# Patient Record
Sex: Female | Born: 1950 | Race: Black or African American | Hispanic: No | Smoking: Former smoker
Health system: Southern US, Community
[De-identification: ages and names within clinical notes are randomized; demographics above are authoritative.]

## PROBLEM LIST (undated history)

## (undated) DIAGNOSIS — H9190 Unspecified hearing loss, unspecified ear: Secondary | ICD-10-CM

## (undated) DIAGNOSIS — I10 Essential (primary) hypertension: Secondary | ICD-10-CM

## (undated) DIAGNOSIS — I671 Cerebral aneurysm, nonruptured: Secondary | ICD-10-CM

## (undated) DIAGNOSIS — J449 Chronic obstructive pulmonary disease, unspecified: Secondary | ICD-10-CM

## (undated) HISTORY — PX: TUBAL LIGATION: SHX77

---

## 2017-12-25 ENCOUNTER — Emergency Department (HOSPITAL_BASED_OUTPATIENT_CLINIC_OR_DEPARTMENT_OTHER): Payer: Medicare Other

## 2017-12-25 ENCOUNTER — Emergency Department (HOSPITAL_BASED_OUTPATIENT_CLINIC_OR_DEPARTMENT_OTHER)
Admission: EM | Admit: 2017-12-25 | Discharge: 2017-12-25 | Disposition: A | Discharge status: Home / Self Care | Payer: Medicare Other | Attending: Emergency Medicine | Admitting: Emergency Medicine

## 2017-12-25 ENCOUNTER — Encounter (HOSPITAL_BASED_OUTPATIENT_CLINIC_OR_DEPARTMENT_OTHER): Payer: Self-pay

## 2017-12-25 ENCOUNTER — Other Ambulatory Visit: Payer: Self-pay

## 2017-12-25 DIAGNOSIS — R531 Weakness: Secondary | ICD-10-CM | POA: Diagnosis not present

## 2017-12-25 DIAGNOSIS — R0602 Shortness of breath: Secondary | ICD-10-CM | POA: Diagnosis not present

## 2017-12-25 DIAGNOSIS — R1010 Upper abdominal pain, unspecified: Secondary | ICD-10-CM

## 2017-12-25 DIAGNOSIS — Z87891 Personal history of nicotine dependence: Secondary | ICD-10-CM | POA: Insufficient documentation

## 2017-12-25 DIAGNOSIS — R030 Elevated blood-pressure reading, without diagnosis of hypertension: Secondary | ICD-10-CM

## 2017-12-25 DIAGNOSIS — Z7982 Long term (current) use of aspirin: Secondary | ICD-10-CM | POA: Diagnosis not present

## 2017-12-25 DIAGNOSIS — R110 Nausea: Secondary | ICD-10-CM

## 2017-12-25 DIAGNOSIS — J449 Chronic obstructive pulmonary disease, unspecified: Secondary | ICD-10-CM | POA: Insufficient documentation

## 2017-12-25 DIAGNOSIS — I10 Essential (primary) hypertension: Secondary | ICD-10-CM | POA: Diagnosis not present

## 2017-12-25 DIAGNOSIS — Z79899 Other long term (current) drug therapy: Secondary | ICD-10-CM | POA: Insufficient documentation

## 2017-12-25 HISTORY — DX: Chronic obstructive pulmonary disease, unspecified: J44.9

## 2017-12-25 HISTORY — DX: Essential (primary) hypertension: I10

## 2017-12-25 HISTORY — DX: Cerebral aneurysm, nonruptured: I67.1

## 2017-12-25 HISTORY — DX: Unspecified hearing loss, unspecified ear: H91.90

## 2017-12-25 LAB — COMPREHENSIVE METABOLIC PANEL
ALK PHOS: 68 U/L (ref 38–126)
ALT: 28 U/L (ref 14–54)
AST: 36 U/L (ref 15–41)
Albumin: 4 g/dL (ref 3.5–5.0)
Anion gap: 9 (ref 5–15)
BUN: 5 mg/dL — AB (ref 6–20)
CALCIUM: 9 mg/dL (ref 8.9–10.3)
CHLORIDE: 101 mmol/L (ref 101–111)
CO2: 22 mmol/L (ref 22–32)
CREATININE: 0.89 mg/dL (ref 0.44–1.00)
GFR calc Af Amer: >60 mL/min (ref >60)
GFR calc non Af Amer: >60 mL/min (ref >60)
GLUCOSE: 86 mg/dL (ref 65–99)
Potassium: 3.2 mmol/L — ABNORMAL LOW (ref 3.5–5.1)
SODIUM: 132 mmol/L — AB (ref 135–145)
Total Bilirubin: 0.4 mg/dL (ref 0.3–1.2)
Total Protein: 7 g/dL (ref 6.5–8.1)

## 2017-12-25 LAB — CBC WITH DIFFERENTIAL/PLATELET
BASOS ABS: 0 10*3/uL (ref 0.0–0.1)
Basophils Relative: 0 %
EOS ABS: 0.2 10*3/uL (ref 0.0–0.7)
EOS PCT: 4 %
HCT: 36.8 % (ref 36.0–46.0)
HEMOGLOBIN: 12.9 g/dL (ref 12.0–15.0)
LYMPHS ABS: 2.3 10*3/uL (ref 0.7–4.0)
LYMPHS PCT: 44 %
MCH: 28.7 pg (ref 26.0–34.0)
MCHC: 35.1 g/dL (ref 30.0–36.0)
MCV: 82 fL (ref 78.0–100.0)
Monocytes Absolute: 0.5 10*3/uL (ref 0.1–1.0)
Monocytes Relative: 9 %
NEUTROS PCT: 43 %
Neutro Abs: 2.3 10*3/uL (ref 1.7–7.7)
PLATELETS: 292 10*3/uL (ref 150–400)
RBC: 4.49 MIL/uL (ref 3.87–5.11)
RDW: 13.8 % (ref 11.5–15.5)
WBC: 5.3 10*3/uL (ref 4.0–10.5)

## 2017-12-25 LAB — LIPASE, BLOOD: Lipase: 21 U/L (ref 11–51)

## 2017-12-25 LAB — BRAIN NATRIURETIC PEPTIDE: B Natriuretic Peptide: 9.5 pg/mL (ref 0.0–100.0)

## 2017-12-25 LAB — TROPONIN I: Troponin I: <0.03 ng/mL (ref <0.03)

## 2017-12-25 MED ORDER — GI COCKTAIL ~~LOC~~
30.0000 mL | Freq: Once | ORAL | Status: AC
  Administered 2017-12-25: 30 mL via ORAL
  Filled 2017-12-25: qty 30

## 2017-12-25 MED ORDER — ONDANSETRON HCL 4 MG/2ML IJ SOLN
4.0000 mg | Freq: Once | INTRAMUSCULAR | Status: AC
  Administered 2017-12-25: 4 mg via INTRAVENOUS
  Filled 2017-12-25: qty 2

## 2017-12-25 MED ORDER — PANTOPRAZOLE SODIUM 20 MG PO TBEC: 20.0000 mg | DELAYED_RELEASE_TABLET | Freq: Every day | ORAL | 0 refills | Status: AC

## 2017-12-25 MED ORDER — ONDANSETRON 4 MG PO TBDP: ORAL_TABLET | ORAL | 0 refills | Status: AC

## 2017-12-25 NOTE — ED Triage Notes (Signed)
Pt c/o elevated BP today-hx of HTN-compliant with meds-NAD-steady gait

## 2017-12-25 NOTE — ED Provider Notes (Signed)
[facility] EMERGENCY DEPARTMENT Provider Note   CSN: [id number] Arrival date & time: 12/25/17  1942     History   Chief Complaint Chief Complaint  Patient presents with  . Hypertension    HPI Tricia Parsons is a 67 y.o. female.  HPI Patient states she has not felt well today.  She has had nausea without vomiting.  Complains of generalized weakness and some shortness of breath.  Denies cough.  States she took her blood pressure this evening and it was elevated prompting her to come to the emergency department for medical evaluation.  She denies numbness or focal weakness.  No visual changes. Past Medical History:  Diagnosis Date  . Brain aneurysm   . COPD (chronic obstructive pulmonary disease) (HCC)   . Hearing loss   . Hypertension     There are no active problems to display for this patient.   Past Surgical History:  Procedure Laterality Date  . TUBAL LIGATION       OB History   None      Home Medications    Prior to Admission medications   Medication Sig Start Date End Date Taking? Authorizing Provider  aspirin EC 81 MG tablet Take 81 mg by mouth daily.   Yes [provider]  atorvastatin (LIPITOR) 40 MG tablet Take 40 mg by mouth daily.   Yes [provider]  buPROPion (WELLBUTRIN SR) 150 MG 12 hr tablet Take 150 mg by mouth 2 (two) times daily.   Yes [provider]  Fluticasone-Salmeterol (ADVAIR) 250-50 MCG/DOSE AEPB Inhale 1 puff into the lungs 2 (two) times daily.   Yes [provider]  LORazepam (ATIVAN) 1 MG tablet Take 1 mg by mouth 2 (two) times daily.   Yes [provider]  telmisartan-hydrochlorothiazide (MICARDIS HCT) 40-12.5 MG tablet Take 1 tablet by mouth daily.   Yes [provider]  tiotropium (SPIRIVA) 18 MCG inhalation capsule Place 18 mcg into inhaler and inhale daily.   Yes [provider]  ondansetron (ZOFRAN ODT) 4 MG disintegrating tablet  ODT q4 hours prn  nausea/vomit 12/25/17   Loren Racer, MD  pantoprazole (PROTONIX) 20 MG tablet Take 1 tablet (20 mg total) by mouth daily. 12/25/17   Loren Racer, MD    Family History No family history on file.  Social History Social History   Tobacco Use  . Smoking status: Former Games developer  . Smokeless tobacco: Never Used  Substance Use Topics  . Alcohol use: Not Currently  . 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 Negative for chills and fever.  HENT: Negative for congestion, sinus pressure and sore throat.   Eyes: Negative for visual disturbance.  Respiratory: Positive for shortness of breath. Negative for cough, chest tightness and wheezing.   Cardiovascular: Negative for chest pain, palpitations and leg swelling.  Gastrointestinal: Positive for abdominal pain, nausea and vomiting. Negative for constipation and diarrhea.  Genitourinary: Negative for dysuria, flank pain and frequency.  Musculoskeletal: Negative for back pain, gait problem, myalgias and neck pain.  Skin: Negative for rash and wound.  Neurological: Negative for dizziness, syncope, speech difficulty, weakness, light-headedness, numbness and headaches.  All other systems reviewed and are negative.    Physical Exam Updated Vital Signs BP (!) 163/105 (BP Location: Left Arm)   Pulse 78   Temp 98.3  F (36.8 C) (Oral)   Resp (!) 24   Ht  (1.575 m)   Wt 66.2 kg (146 lb)   SpO2 92%   BMI 26.70 kg/m   Physical Exam  Constitutional: She is oriented to person, place, and time. She appears well-developed and well-nourished. No distress.  HENT:  Head: Normocephalic and atraumatic.  Mouth/Throat: Oropharynx is clear and moist. No oropharyngeal exudate.  Eyes: Pupils are equal, round, and reactive to light. EOM are normal.  Neck: Normal range of motion. Neck supple. JVD  present.  Cardiovascular: Normal rate and regular rhythm. Exam reveals no gallop and no friction rub.  No murmur heard. Pulmonary/Chest: Effort normal. She has rales.  Crackles in bilateral bases.  Abdominal: Soft. Bowel sounds are normal. There is tenderness. There is no rebound and no guarding.  Epigastric tenderness to palpation.  No rebound or guarding.  Musculoskeletal: Normal range of motion. She exhibits no edema or tenderness.  No midline thoracic or lumbar tenderness.  No CVA tenderness.  No lower extremity swelling, asymmetry or tenderness.  2+ distal pulses in all extremities.  Lymphadenopathy:    She has no cervical adenopathy.  Neurological: She is alert and oriented to person, place, and time.  Patient is alert and oriented x3 with clear, goal oriented speech. Patient has 5/5 motor in all extremities. Sensation is intact to light touch. Bilateral finger-to-nose is normal with no signs of dysmetria.  Skin: Skin is warm and dry. No rash noted. She is not diaphoretic. No erythema.  Psychiatric: She has a normal mood and affect. Her behavior is normal.  Nursing note and vitals reviewed.    ED Treatments / Results  Labs (all labs ordered are listed, but only abnormal results are displayed) Labs Reviewed  COMPREHENSIVE METABOLIC PANEL - Abnormal; Notable for the following components:      Result Value   Sodium 132 (*)    Potassium 3.2 (*)    BUN 5 (*)    All other components within normal limits  CBC WITH DIFFERENTIAL/PLATELET  BRAIN NATRIURETIC PEPTIDE  TROPONIN I  LIPASE, BLOOD    EKG EKG Interpretation  Date/Time:  Tuesday Dec 25 2017 21:14:52 EDT Ventricular Rate:  67 PR Interval:    QRS Duration: 95 QT Interval:  447 QTC Calculation: 472 R Axis:   -51 Text Interpretation:  Sinus rhythm LAD, consider left anterior fascicular block Borderline T wave abnormalities Confirmed by Loren Racer ([id number]) on 12/25/2017 9:51:23 PM   Radiology Dg Chest 2  View  Result Date: 12/25/2017 CLINICAL DATA:  Shortness of breath and hypertension tonight. History of COPD. EXAM: CHEST - 2 VIEW COMPARISON:  None. FINDINGS: Cardiac silhouette is upper limits of normal in size, mediastinal silhouette is nonsuspicious. LEFT lung base and bilateral midlung zone strandy densities. No pleural effusion or focal consolidation. Mildly increased lung volumes. No pneumothorax. Soft tissue planes and included osseous structures are nonsuspicious. IMPRESSION: Bilateral atelectasis/scarring. Borderline cardiomegaly. Electronically Signed   By: Awilda Metro M.D.   On: 12/25/2017 21:52    Procedures Procedures (including critical care time)  Medications Ordered in ED Medications  gi cocktail (Maalox,Lidocaine,Donnatal) (30 mLs Oral Given 12/25/17 2237)  ondansetron (ZOFRAN) injection 4 mg (4 mg Intravenous Given 12/25/17 2306)     Initial Impression / Assessment and Plan / ED Course  I have reviewed the triage vital signs and the nursing notes.  Pertinent labs & imaging results that were available during my care of the patient were reviewed by me and  considered in my medical decision making (see chart for details).     Blood pressure is improving without treatment.  No vomiting in the emergency department.  Check exam.  Work-up is essentially negative except for mild hypokalemia.  She is abdominal resolved after GI cocktail.  States she normally wears 2 L of oxygen mostly at night.  Chest very mild expiratory wheezes.  Suspect likely gastritis with reflux.  EKG with nonspecific T wave abnormalities with no prior EKG for comparison.  Given onset of symptoms this morning, single troponin sufficient to rule out myocardial injury. Start on PPI and antiemetic.  Patient is encouraged to follow-up with primary physician and gastroneurology.  Return precautions given. Final Clinical Impressions(s) / ED Diagnoses   Final diagnoses:  Pain of upper abdomen  Nausea   Elevated blood pressure reading    ED Discharge Orders        Ordered    pantoprazole (PROTONIX) 20 MG tablet  Daily     12/25/17 2317    ondansetron (ZOFRAN ODT) 4 MG disintegrating tablet     12/25/17 2317       Loren Racer, MD 12/25/17 2318

## 2019-09-15 IMAGING — CR DG CHEST 2V
2 series · 2 of 2 positions shown · non-contrast
Comparison: None.

CLINICAL DATA: Shortness of breath and hypertension tonight.
History of COPD.

EXAM:
CHEST - 2 VIEW

[w chest pa]
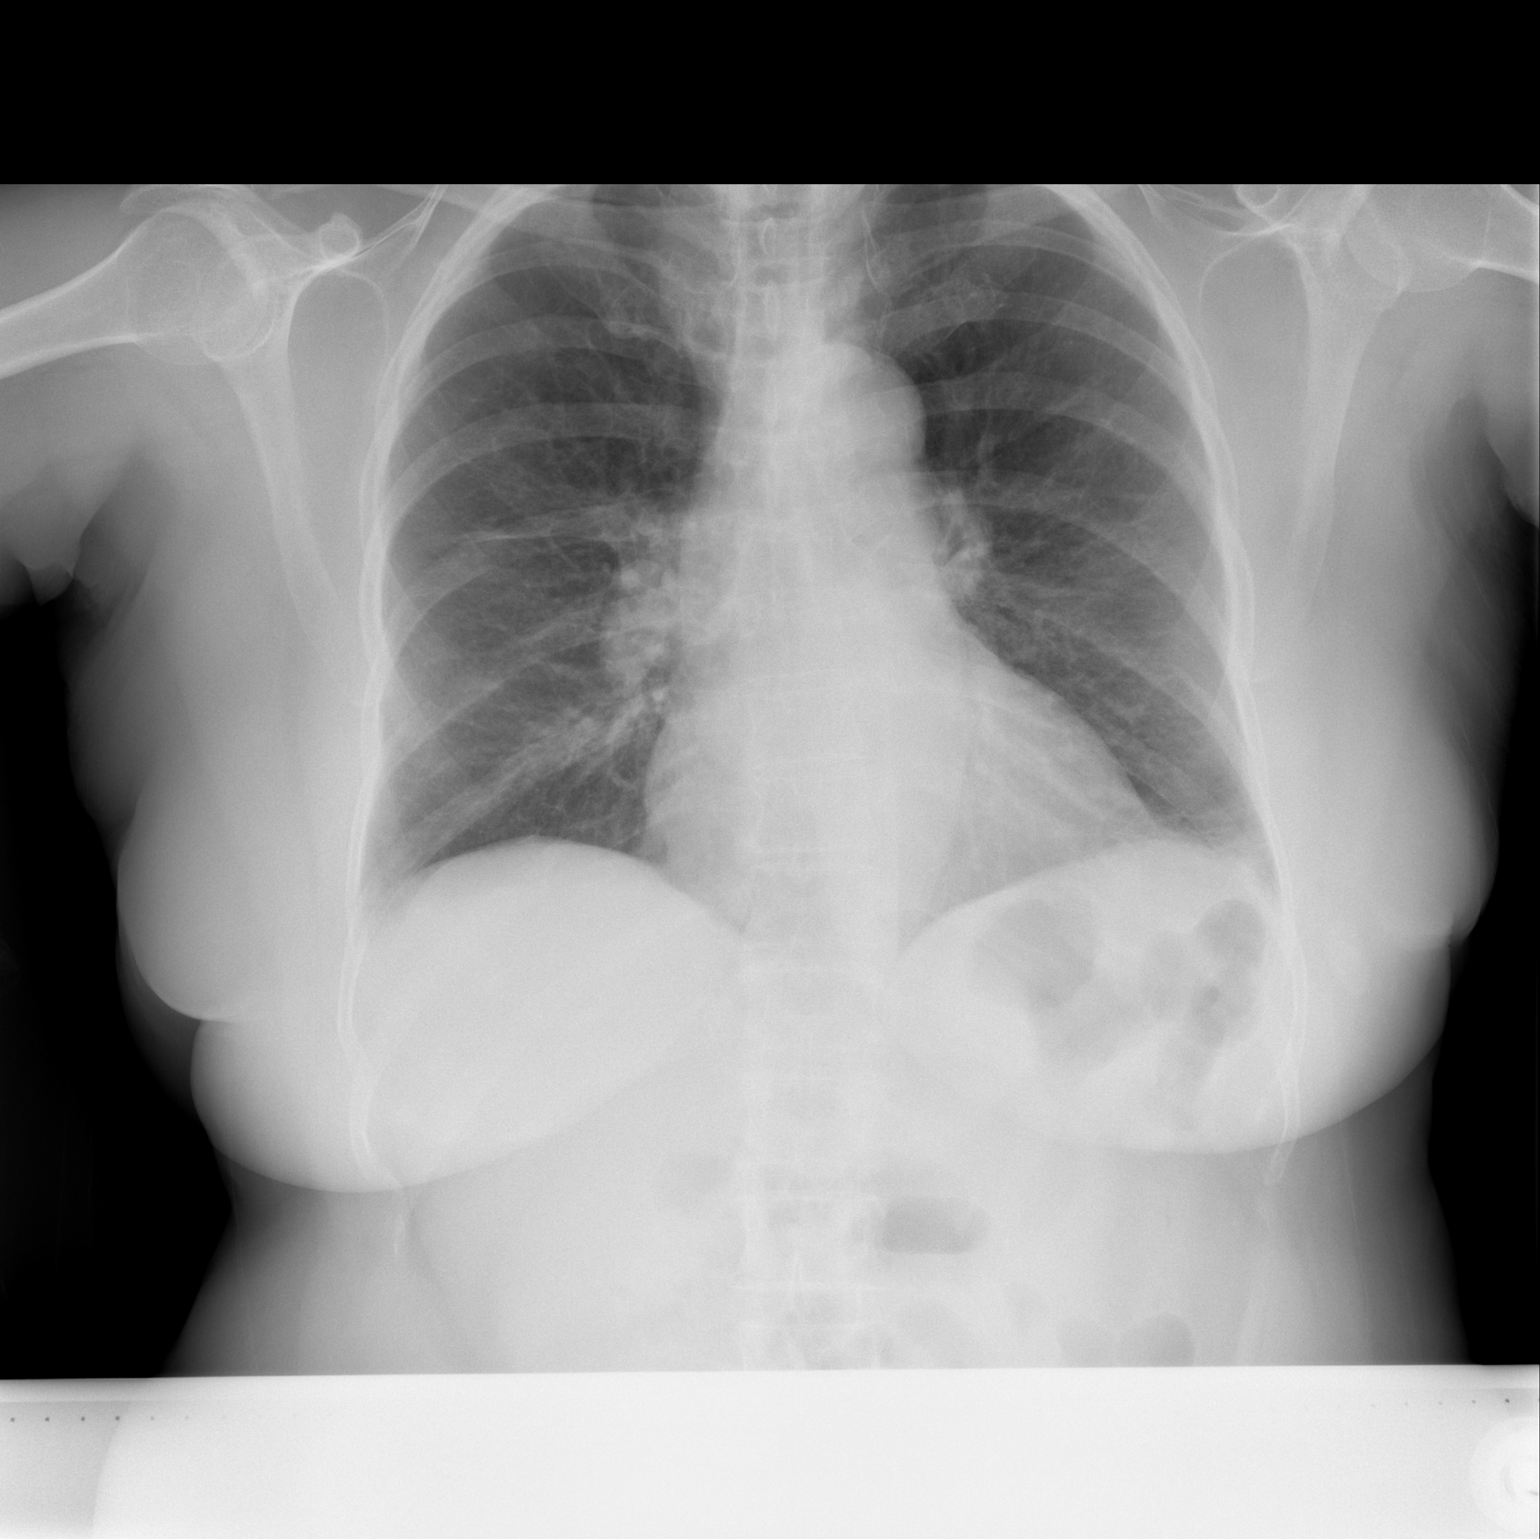

[w chest lat]
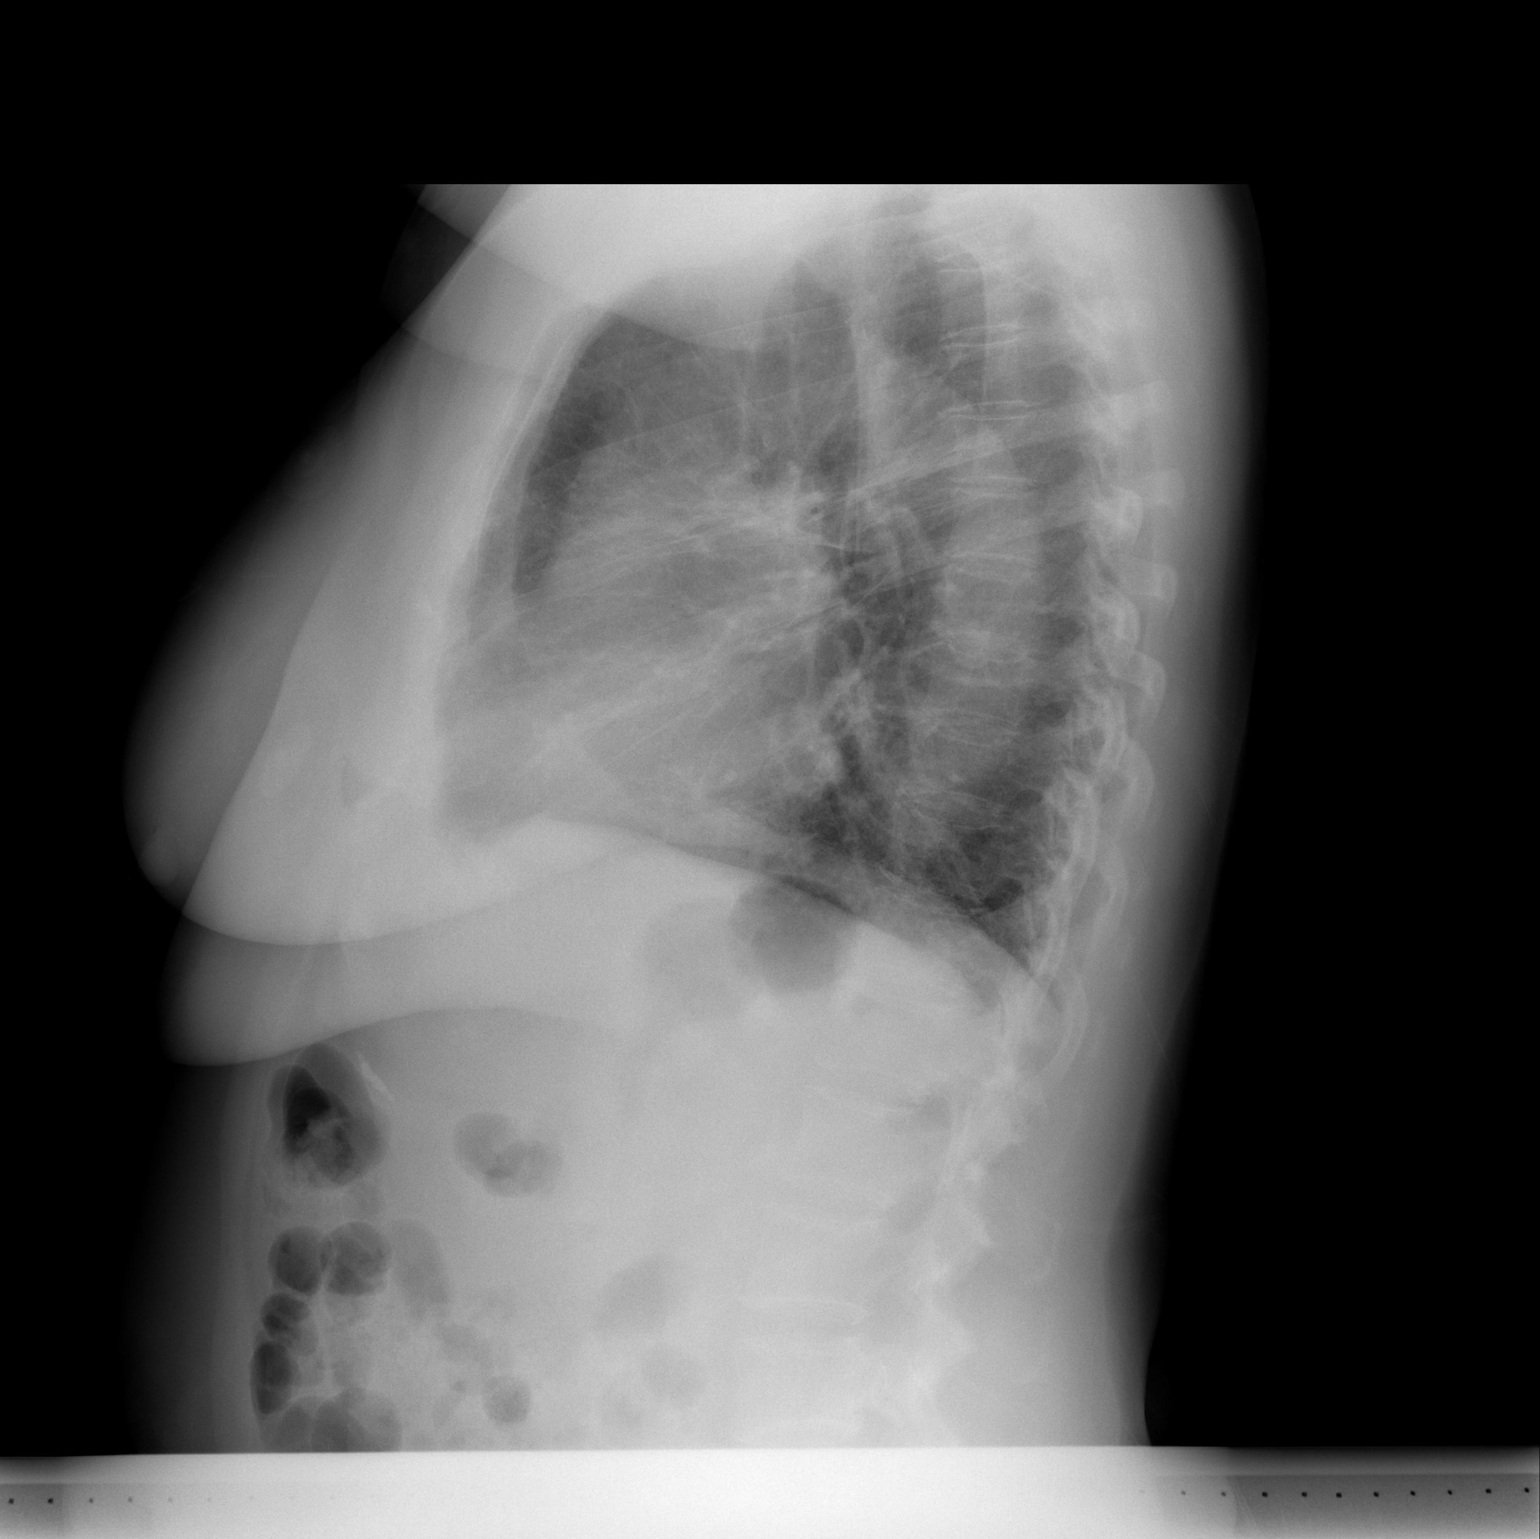

[2 of 2 positions shown; findings below may reference images not displayed]

FINDINGS: Cardiac silhouette is upper limits of normal in size, mediastinal
silhouette is nonsuspicious. LEFT lung base and bilateral midlung
zone strandy densities. No pleural effusion or focal consolidation.
Mildly increased lung volumes. No pneumothorax. Soft tissue planes
and included osseous structures are nonsuspicious.
IMPRESSION: Bilateral atelectasis/scarring.

Borderline cardiomegaly.
